# Patient Record
Sex: Male | Born: 2000 | Hispanic: Yes | Marital: Single | Smoking: Never smoker
Health system: Southern US, Community
[De-identification: ages and names within clinical notes are randomized; demographics above are authoritative.]

## PROBLEM LIST (undated history)

## (undated) DIAGNOSIS — R011 Cardiac murmur, unspecified: Secondary | ICD-10-CM

## (undated) SURGERY — Surgical Case
Anesthesia: *Unknown

---
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
## 2020-11-21 ENCOUNTER — Other Ambulatory Visit: Payer: Self-pay

## 2020-11-21 ENCOUNTER — Inpatient Hospital Stay (HOSPITAL_COMMUNITY)
Admission: EM | Admit: 2020-11-21 | Discharge: 2020-11-26 | DRG: 329 | Disposition: A | Discharge status: Home / Self Care | Payer: Medicaid Other | Attending: Surgery | Admitting: Surgery

## 2020-11-21 ENCOUNTER — Emergency Department (HOSPITAL_COMMUNITY): Payer: Medicaid Other

## 2020-11-21 ENCOUNTER — Emergency Department (HOSPITAL_COMMUNITY): Payer: Medicaid Other | Admitting: Certified Registered Nurse Anesthetist

## 2020-11-21 ENCOUNTER — Encounter (HOSPITAL_COMMUNITY): Admission: EM | Disposition: A | Discharge status: Home / Self Care | Payer: Self-pay | Source: Home / Self Care

## 2020-11-21 DIAGNOSIS — S1191XA Laceration without foreign body of unspecified part of neck, initial encounter: Secondary | ICD-10-CM

## 2020-11-21 DIAGNOSIS — T148XXA Other injury of unspecified body region, initial encounter: Principal | ICD-10-CM

## 2020-11-21 DIAGNOSIS — T1490XA Injury, unspecified, initial encounter: Secondary | ICD-10-CM

## 2020-11-21 DIAGNOSIS — S1181XA Laceration without foreign body of other specified part of neck, initial encounter: Secondary | ICD-10-CM | POA: Diagnosis present

## 2020-11-21 DIAGNOSIS — R739 Hyperglycemia, unspecified: Secondary | ICD-10-CM | POA: Diagnosis present

## 2020-11-21 DIAGNOSIS — W269XXA Contact with unspecified sharp object(s), initial encounter: Secondary | ICD-10-CM

## 2020-11-21 DIAGNOSIS — Z20822 Contact with and (suspected) exposure to covid-19: Secondary | ICD-10-CM | POA: Diagnosis present

## 2020-11-21 DIAGNOSIS — D72829 Elevated white blood cell count, unspecified: Secondary | ICD-10-CM | POA: Diagnosis present

## 2020-11-21 DIAGNOSIS — S36438A Laceration of other part of small intestine, initial encounter: Secondary | ICD-10-CM | POA: Diagnosis present

## 2020-11-21 DIAGNOSIS — D62 Acute posthemorrhagic anemia: Secondary | ICD-10-CM | POA: Diagnosis present

## 2020-11-21 DIAGNOSIS — S31611A Laceration without foreign body of abdominal wall, left upper quadrant with penetration into peritoneal cavity, initial encounter: Secondary | ICD-10-CM | POA: Diagnosis present

## 2020-11-21 HISTORY — PX: SMALL BOWEL REPAIR: SHX6447

## 2020-11-21 HISTORY — PX: ESOPHAGOGASTRODUODENOSCOPY: SHX5428

## 2020-11-21 HISTORY — PX: INCISION AND DRAINAGE OF WOUND: SHX1803

## 2020-11-21 HISTORY — PX: LAPAROTOMY: SHX154

## 2020-11-21 HISTORY — PX: ABDOMINAL WALL DEFECT REPAIR: SHX53

## 2020-11-21 LAB — CBC
HCT: 40.5 % (ref 39.0–52.0)
HCT: 32 % — ABNORMAL LOW (ref 39.0–52.0)
HCT: 28.2 % — ABNORMAL LOW (ref 39.0–52.0)
Hemoglobin: 12.9 g/dL — ABNORMAL LOW (ref 13.0–17.0)
Hemoglobin: 10.5 g/dL — ABNORMAL LOW (ref 13.0–17.0)
Hemoglobin: 9.4 g/dL — ABNORMAL LOW (ref 13.0–17.0)
MCH: 28.7 pg (ref 26.0–34.0)
MCH: 29.1 pg (ref 26.0–34.0)
MCH: 29.4 pg (ref 26.0–34.0)
MCHC: 31.9 g/dL (ref 30.0–36.0)
MCHC: 32.8 g/dL (ref 30.0–36.0)
MCHC: 33.3 g/dL (ref 30.0–36.0)
MCV: 90 fL (ref 80.0–100.0)
MCV: 88.6 fL (ref 80.0–100.0)
MCV: 88.1 fL (ref 80.0–100.0)
Platelets: 254 10*3/uL (ref 150–400)
Platelets: 174 10*3/uL (ref 150–400)
Platelets: 169 10*3/uL (ref 150–400)
RBC: 4.5 MIL/uL (ref 4.22–5.81)
RBC: 3.61 MIL/uL — ABNORMAL LOW (ref 4.22–5.81)
RBC: 3.2 MIL/uL — ABNORMAL LOW (ref 4.22–5.81)
RDW: 13.2 % (ref 11.5–15.5)
RDW: 13.2 % (ref 11.5–15.5)
RDW: 13.2 % (ref 11.5–15.5)
WBC: 12.3 10*3/uL — ABNORMAL HIGH (ref 4.0–10.5)
WBC: 20 10*3/uL — ABNORMAL HIGH (ref 4.0–10.5)
WBC: 20.5 10*3/uL — ABNORMAL HIGH (ref 4.0–10.5)
nRBC: 0 % (ref 0.0–0.2)
nRBC: 0 % (ref 0.0–0.2)
nRBC: 0 % (ref 0.0–0.2)

## 2020-11-21 LAB — COMPREHENSIVE METABOLIC PANEL
ALT: 52 U/L — ABNORMAL HIGH (ref 0–44)
AST: 49 U/L — ABNORMAL HIGH (ref 15–41)
Albumin: 3.5 g/dL (ref 3.5–5.0)
Alkaline Phosphatase: 71 U/L (ref 38–126)
Anion gap: 10 (ref 5–15)
BUN: 15 mg/dL (ref 6–20)
CO2: 24 mmol/L (ref 22–32)
Calcium: 8.8 mg/dL — ABNORMAL LOW (ref 8.9–10.3)
Chloride: 104 mmol/L (ref 98–111)
Creatinine, Ser: 1.26 mg/dL — ABNORMAL HIGH (ref 0.61–1.24)
GFR, Estimated: >60 mL/min (ref >60)
Glucose, Bld: 168 mg/dL — ABNORMAL HIGH (ref 70–99)
Potassium: 3.8 mmol/L (ref 3.5–5.1)
Sodium: 138 mmol/L (ref 135–145)
Total Bilirubin: 0.9 mg/dL (ref 0.3–1.2)
Total Protein: 6.4 g/dL — ABNORMAL LOW (ref 6.5–8.1)

## 2020-11-21 LAB — URINALYSIS, ROUTINE W REFLEX MICROSCOPIC
Bilirubin Urine: NEGATIVE
Glucose, UA: NEGATIVE mg/dL
Hgb urine dipstick: NEGATIVE
Ketones, ur: NEGATIVE mg/dL
Leukocytes,Ua: NEGATIVE
Nitrite: NEGATIVE
Protein, ur: NEGATIVE mg/dL
Specific Gravity, Urine: >1.046 — ABNORMAL HIGH (ref 1.005–1.030)
pH: 5 (ref 5.0–8.0)

## 2020-11-21 LAB — RESP PANEL BY RT-PCR (FLU A&B, COVID) ARPGX2
Influenza A by PCR: NEGATIVE
Influenza B by PCR: NEGATIVE
SARS Coronavirus 2 by RT PCR: NEGATIVE

## 2020-11-21 LAB — ETHANOL: Alcohol, Ethyl (B): <10 mg/dL (ref <10)

## 2020-11-21 LAB — I-STAT CHEM 8, ED
BUN: 20 mg/dL (ref 6–20)
Calcium, Ion: 1.15 mmol/L (ref 1.15–1.40)
Chloride: 105 mmol/L (ref 98–111)
Creatinine, Ser: 1.2 mg/dL (ref 0.61–1.24)
Glucose, Bld: 167 mg/dL — ABNORMAL HIGH (ref 70–99)
HCT: 40 % (ref 39.0–52.0)
Hemoglobin: 13.6 g/dL (ref 13.0–17.0)
Potassium: 3.7 mmol/L (ref 3.5–5.1)
Sodium: 141 mmol/L (ref 135–145)
TCO2: 26 mmol/L (ref 22–32)

## 2020-11-21 LAB — MRSA PCR SCREENING: MRSA by PCR: NEGATIVE

## 2020-11-21 LAB — PROTIME-INR
INR: 1.2 (ref 0.8–1.2)
Prothrombin Time: 14.5 seconds (ref 11.4–15.2)

## 2020-11-21 LAB — SAMPLE TO BLOOD BANK

## 2020-11-21 LAB — BASIC METABOLIC PANEL
Anion gap: 7 (ref 5–15)
BUN: 19 mg/dL (ref 6–20)
CO2: 23 mmol/L (ref 22–32)
Calcium: 8.1 mg/dL — ABNORMAL LOW (ref 8.9–10.3)
Chloride: 108 mmol/L (ref 98–111)
Creatinine, Ser: 1 mg/dL (ref 0.61–1.24)
GFR, Estimated: >60 mL/min (ref >60)
Glucose, Bld: 149 mg/dL — ABNORMAL HIGH (ref 70–99)
Potassium: 4.5 mmol/L (ref 3.5–5.1)
Sodium: 138 mmol/L (ref 135–145)

## 2020-11-21 LAB — ABO/RH: ABO/RH(D): O POS

## 2020-11-21 LAB — LACTIC ACID, PLASMA: Lactic Acid, Venous: 2 mmol/L (ref 0.5–1.9)

## 2020-11-21 LAB — PREPARE RBC (CROSSMATCH)

## 2020-11-21 LAB — HIV ANTIBODY (ROUTINE TESTING W REFLEX): HIV Screen 4th Generation wRfx: NONREACTIVE

## 2020-11-21 SURGERY — LAPAROTOMY, EXPLORATORY
Anesthesia: General | Site: Neck

## 2020-11-21 MED ORDER — HYDROMORPHONE HCL 1 MG/ML IJ SOLN
INTRAMUSCULAR | Status: AC
  Filled 2020-11-21: qty 0.5

## 2020-11-21 MED ORDER — CEFAZOLIN SODIUM-DEXTROSE 2-3 GM-%(50ML) IV SOLR
INTRAVENOUS | Status: DC | PRN
  Administered 2020-11-21: 2 g via INTRAVENOUS

## 2020-11-21 MED ORDER — LIDOCAINE 2% (20 MG/ML) 5 ML SYRINGE
INTRAMUSCULAR | Status: DC | PRN
  Administered 2020-11-21: 60 mg via INTRAVENOUS

## 2020-11-21 MED ORDER — METRONIDAZOLE IN NACL 5-0.79 MG/ML-% IV SOLN
INTRAVENOUS | Status: DC | PRN
  Administered 2020-11-21: 500 mg via INTRAVENOUS

## 2020-11-21 MED ORDER — DEXMEDETOMIDINE (PRECEDEX) IN NS 20 MCG/5ML (4 MCG/ML) IV SYRINGE
PREFILLED_SYRINGE | INTRAVENOUS | Status: DC | PRN
  Administered 2020-11-21 (×2): 8 ug via INTRAVENOUS
  Administered 2020-11-21 (×2): 12 ug via INTRAVENOUS

## 2020-11-21 MED ORDER — KETAMINE HCL 100 MG/ML IJ SOLN
INTRAMUSCULAR | Status: DC | PRN
  Administered 2020-11-21: 50 mg via INTRAVENOUS

## 2020-11-21 MED ORDER — ACETAMINOPHEN 10 MG/ML IV SOLN
INTRAVENOUS | Status: DC | PRN
  Administered 2020-11-21: 1000 mg via INTRAVENOUS

## 2020-11-21 MED ORDER — ACETAMINOPHEN 10 MG/ML IV SOLN
INTRAVENOUS | Status: AC
  Filled 2020-11-21: qty 100

## 2020-11-21 MED ORDER — SUGAMMADEX SODIUM 200 MG/2ML IV SOLN
INTRAVENOUS | Status: DC | PRN
  Administered 2020-11-21: 300 mg via INTRAVENOUS

## 2020-11-21 MED ORDER — 0.9 % SODIUM CHLORIDE (POUR BTL) OPTIME
TOPICAL | Status: DC | PRN
  Administered 2020-11-21 (×4): 1000 mL

## 2020-11-21 MED ORDER — PROPOFOL 500 MG/50ML IV EMUL
INTRAVENOUS | Status: DC | PRN
  Administered 2020-11-21: 50 ug/kg/min via INTRAVENOUS

## 2020-11-21 MED ORDER — IOHEXOL 350 MG/ML SOLN
75.0000 mL | Freq: Once | INTRAVENOUS | Status: AC | PRN
  Administered 2020-11-21: 75 mL via INTRAVENOUS

## 2020-11-21 MED ORDER — FENTANYL 2500MCG IN NS 250ML (10MCG/ML) PREMIX INFUSION
50.0000 ug/h | INTRAVENOUS | Status: DC
  Administered 2020-11-21: 50 ug/h via INTRAVENOUS
  Administered 2020-11-21: 200 ug/h via INTRAVENOUS
  Filled 2020-11-21 (×3): qty 250

## 2020-11-21 MED ORDER — LACTATED RINGERS IV SOLN: INTRAVENOUS | Status: DC | PRN

## 2020-11-21 MED ORDER — ALBUMIN HUMAN 5 % IV SOLN: INTRAVENOUS | Status: DC | PRN

## 2020-11-21 MED ORDER — ONDANSETRON 4 MG PO TBDP: 4.0000 mg | ORAL_TABLET | Freq: Four times a day (QID) | ORAL | Status: DC | PRN

## 2020-11-21 MED ORDER — FENTANYL CITRATE (PF) 250 MCG/5ML IJ SOLN
INTRAMUSCULAR | Status: AC
  Filled 2020-11-21: qty 5

## 2020-11-21 MED ORDER — PROPOFOL 10 MG/ML IV BOLUS
INTRAVENOUS | Status: DC | PRN
  Administered 2020-11-21: 200 mg via INTRAVENOUS
  Administered 2020-11-21: 100 mg via INTRAVENOUS

## 2020-11-21 MED ORDER — SUCCINYLCHOLINE CHLORIDE 200 MG/10ML IV SOSY
PREFILLED_SYRINGE | INTRAVENOUS | Status: DC | PRN
  Administered 2020-11-21: 140 mg via INTRAVENOUS

## 2020-11-21 MED ORDER — SODIUM CHLORIDE 0.9 % IV SOLN: INTRAVENOUS | Status: DC

## 2020-11-21 MED ORDER — ACETAMINOPHEN 10 MG/ML IV SOLN
1000.0000 mg | Freq: Four times a day (QID) | INTRAVENOUS | Status: AC
  Administered 2020-11-21 – 2020-11-22 (×3): 1000 mg via INTRAVENOUS
  Filled 2020-11-21 (×3): qty 100

## 2020-11-21 MED ORDER — SODIUM CHLORIDE 0.9 % IV SOLN: INTRAVENOUS | Status: DC | PRN

## 2020-11-21 MED ORDER — CHLORHEXIDINE GLUCONATE CLOTH 2 % EX PADS
6.0000 | MEDICATED_PAD | Freq: Every day | CUTANEOUS | Status: DC
  Administered 2020-11-24: 6 via TOPICAL

## 2020-11-21 MED ORDER — MIDAZOLAM HCL 2 MG/2ML IJ SOLN
INTRAMUSCULAR | Status: AC
  Filled 2020-11-21: qty 2

## 2020-11-21 MED ORDER — MIDAZOLAM HCL 5 MG/5ML IJ SOLN
INTRAMUSCULAR | Status: DC | PRN
  Administered 2020-11-21 (×2): 2 mg via INTRAVENOUS

## 2020-11-21 MED ORDER — FENTANYL CITRATE (PF) 250 MCG/5ML IJ SOLN
INTRAMUSCULAR | Status: DC | PRN
  Administered 2020-11-21: 100 ug via INTRAVENOUS
  Administered 2020-11-21: 150 ug via INTRAVENOUS
  Administered 2020-11-21: 50 ug via INTRAVENOUS
  Administered 2020-11-21 (×2): 100 ug via INTRAVENOUS

## 2020-11-21 MED ORDER — PROPOFOL 1000 MG/100ML IV EMUL
5.0000 ug/kg/min | INTRAVENOUS | Status: DC
  Administered 2020-11-21: 60 ug/kg/min via INTRAVENOUS
  Filled 2020-11-21: qty 100
  Filled 2020-11-21: qty 200

## 2020-11-21 MED ORDER — METOPROLOL TARTRATE 5 MG/5ML IV SOLN: 5.0000 mg | Freq: Four times a day (QID) | INTRAVENOUS | Status: DC | PRN

## 2020-11-21 MED ORDER — FENTANYL CITRATE (PF) 100 MCG/2ML IJ SOLN
50.0000 ug | Freq: Once | INTRAMUSCULAR | Status: AC
  Administered 2020-11-21: 50 ug via INTRAVENOUS

## 2020-11-21 MED ORDER — FENTANYL BOLUS VIA INFUSION
50.0000 ug | INTRAVENOUS | Status: DC | PRN
  Administered 2020-11-21 (×2): 50 ug via INTRAVENOUS
  Filled 2020-11-21: qty 50

## 2020-11-21 MED ORDER — DEXAMETHASONE SODIUM PHOSPHATE 10 MG/ML IJ SOLN
INTRAMUSCULAR | Status: DC | PRN
  Administered 2020-11-21: 5 mg via INTRAVENOUS

## 2020-11-21 MED ORDER — ONDANSETRON HCL 4 MG/2ML IJ SOLN: 4.0000 mg | Freq: Four times a day (QID) | INTRAMUSCULAR | Status: DC | PRN

## 2020-11-21 MED ORDER — SODIUM CHLORIDE 0.9% IV SOLUTION: Freq: Once | INTRAVENOUS | Status: DC

## 2020-11-21 MED ORDER — MIDAZOLAM HCL 2 MG/2ML IJ SOLN: 2.0000 mg | INTRAMUSCULAR | Status: DC | PRN

## 2020-11-21 MED ORDER — ROCURONIUM BROMIDE 10 MG/ML (PF) SYRINGE
PREFILLED_SYRINGE | INTRAVENOUS | Status: DC | PRN
  Administered 2020-11-21: 20 mg via INTRAVENOUS
  Administered 2020-11-21: 60 mg via INTRAVENOUS

## 2020-11-21 MED ORDER — PROPOFOL 1000 MG/100ML IV EMUL
0.0000 ug/kg/min | INTRAVENOUS | Status: DC
  Administered 2020-11-21 (×2): 50 ug/kg/min via INTRAVENOUS
  Filled 2020-11-21: qty 100
  Filled 2020-11-21: qty 200
  Filled 2020-11-21: qty 100

## 2020-11-21 MED ORDER — METHOCARBAMOL 1000 MG/10ML IJ SOLN
1000.0000 mg | Freq: Three times a day (TID) | INTRAVENOUS | Status: DC | PRN
  Filled 2020-11-21: qty 10

## 2020-11-21 MED ORDER — PANTOPRAZOLE SODIUM 40 MG IV SOLR
40.0000 mg | INTRAVENOUS | Status: DC
  Administered 2020-11-21 – 2020-11-23 (×3): 40 mg via INTRAVENOUS
  Filled 2020-11-21: qty 40

## 2020-11-21 MED ORDER — HYDROMORPHONE HCL 1 MG/ML IJ SOLN
INTRAMUSCULAR | Status: DC | PRN
  Administered 2020-11-21 (×2): .5 mg via INTRAVENOUS

## 2020-11-21 MED ORDER — KETAMINE HCL 100 MG/ML IJ SOLN
INTRAMUSCULAR | Status: AC
  Filled 2020-11-21: qty 1

## 2020-11-21 MED ORDER — ONDANSETRON HCL 4 MG/2ML IJ SOLN
INTRAMUSCULAR | Status: DC | PRN
  Administered 2020-11-21: 4 mg via INTRAVENOUS

## 2020-11-21 SURGICAL SUPPLY — 51 items
BLADE CLIPPER SURG (BLADE) ×4 IMPLANT
BLADE SURG 15 STRL LF DISP TIS (BLADE) ×3 IMPLANT
BLADE SURG 15 STRL SS (BLADE) ×1
CANISTER SUCT 3000ML PPV (MISCELLANEOUS) ×4 IMPLANT
CHLORAPREP W/TINT 26 (MISCELLANEOUS) ×4 IMPLANT
COVER SURGICAL LIGHT HANDLE (MISCELLANEOUS) ×4 IMPLANT
COVER WAND RF STERILE (DRAPES) IMPLANT
DRAPE INCISE IOBAN 66X45 STRL (DRAPES) ×4 IMPLANT
DRAPE LAPAROSCOPIC ABDOMINAL (DRAPES) ×4 IMPLANT
DRAPE WARM FLUID 44X44 (DRAPES) ×4 IMPLANT
DRSG OPSITE POSTOP 3X4 (GAUZE/BANDAGES/DRESSINGS) ×8 IMPLANT
DRSG OPSITE POSTOP 4X10 (GAUZE/BANDAGES/DRESSINGS) ×4 IMPLANT
ELECT BLADE 6.5 EXT (BLADE) IMPLANT
ELECT CAUTERY BLADE 6.4 (BLADE) ×4 IMPLANT
ELECT REM PT RETURN 9FT ADLT (ELECTROSURGICAL) ×4
ELECTRODE REM PT RTRN 9FT ADLT (ELECTROSURGICAL) ×3 IMPLANT
GLOVE BIOGEL PI IND STRL 6 (GLOVE) ×3 IMPLANT
GLOVE BIOGEL PI INDICATOR 6 (GLOVE) ×1
GLOVE BIOGEL PI MICRO 5.5 (GLOVE) ×1
GLOVE BIOGEL PI MICRO STRL 5.5 (GLOVE) ×3 IMPLANT
GOWN STRL REUS W/ TWL LRG LVL3 (GOWN DISPOSABLE) ×12 IMPLANT
GOWN STRL REUS W/TWL LRG LVL3 (GOWN DISPOSABLE) ×4
HANDLE SUCTION POOLE (INSTRUMENTS) ×3 IMPLANT
KIT BASIN OR (CUSTOM PROCEDURE TRAY) ×4 IMPLANT
KIT TURNOVER KIT B (KITS) ×4 IMPLANT
LIGASURE IMPACT 36 18CM CVD LR (INSTRUMENTS) IMPLANT
NS IRRIG 1000ML POUR BTL (IV SOLUTION) ×8 IMPLANT
PACK GENERAL/GYN (CUSTOM PROCEDURE TRAY) ×4 IMPLANT
PAD ARMBOARD 7.5X6 YLW CONV (MISCELLANEOUS) ×4 IMPLANT
PENCIL SMOKE EVACUATOR (MISCELLANEOUS) ×4 IMPLANT
SPECIMEN JAR LARGE (MISCELLANEOUS) IMPLANT
SPONGE LAP 18X18 RF (DISPOSABLE) ×12 IMPLANT
STAPLER VISISTAT 35W (STAPLE) ×8 IMPLANT
SUCTION POOLE HANDLE (INSTRUMENTS) ×4
SUT ETHILON 3 0 FSL (SUTURE) ×4 IMPLANT
SUT ETHILON 3 0 PS 1 (SUTURE) ×4 IMPLANT
SUT MNCRL AB 4-0 PS2 18 (SUTURE) IMPLANT
SUT PDS AB 1 CT  36 (SUTURE) ×3
SUT PDS AB 1 CT 36 (SUTURE) ×9 IMPLANT
SUT PDS AB 1 TP1 96 (SUTURE) ×8 IMPLANT
SUT SILK 2 0 SH CR/8 (SUTURE) ×4 IMPLANT
SUT SILK 2 0 TIES 10X30 (SUTURE) ×4 IMPLANT
SUT SILK 3 0 RB1 (SUTURE) ×4 IMPLANT
SUT SILK 3 0 SH CR/8 (SUTURE) ×4 IMPLANT
SUT SILK 3 0 TIES 10X30 (SUTURE) ×4 IMPLANT
SUT VIC AB 3-0 SH 18 (SUTURE) ×12 IMPLANT
SUT VIC AB 3-0 SH 27 (SUTURE)
SUT VIC AB 3-0 SH 27XBRD (SUTURE) IMPLANT
TOWEL GREEN STERILE (TOWEL DISPOSABLE) ×4 IMPLANT
TRAY FOLEY MTR SLVR 16FR STAT (SET/KITS/TRAYS/PACK) ×4 IMPLANT
YANKAUER SUCT BULB TIP NO VENT (SUCTIONS) IMPLANT

## 2020-11-21 NOTE — Anesthesia Procedure Notes (Signed)
Arterial Line Insertion Start/End2/25/2022 10:10 AM, 11/21/2020 10:15 AM Performed by: Lannie Fields, DO, anesthesiologist  Patient location: Pre-op. Preanesthetic checklist: patient identified, IV checked, site marked, risks and benefits discussed, surgical consent, monitors and equipment checked, pre-op evaluation, timeout performed and anesthesia consent Lidocaine 1% used for infiltration radial was placed Catheter size: 20 Fr Hand hygiene performed , maximum sterile barriers used  and Seldinger technique used Allen's test indicative of satisfactory collateral circulation Attempts: 2 Procedure performed without using ultrasound guided technique. Following insertion, dressing applied. Post procedure assessment: normal and unchanged  Patient tolerated the procedure well with no immediate complications. Additional procedure comments: crna attempt on right radial unsuccessful .
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
## 2020-11-21 NOTE — ED Notes (Signed)
Pt remains alert and oriented, O2 sats dropping to 86% RA, placed on NRB at 15L and recovering to 100%.  States pain is mostly in LLQ.
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
## 2020-11-21 NOTE — OR Nursing (Signed)
Critical lab value received 11/20/2020 at 1041: lactic acid 2.0. Dr. Freida Busman notified of critical value immediately in OR.

## 2020-11-21 NOTE — Anesthesia Procedure Notes (Signed)
Procedure Name: Intubation Date/Time: 11/21/2020 10:00 AM Performed by: Waynard Edwards, CRNA Pre-anesthesia Checklist: Patient identified, Emergency Drugs available, Suction available, Patient being monitored and Timeout performed Patient Re-evaluated:Patient Re-evaluated prior to induction Oxygen Delivery Method: Circle system utilized Preoxygenation: Pre-oxygenation with 100% oxygen Induction Type: IV induction, Rapid sequence and Cricoid Pressure applied Laryngoscope Size: Glidescope and 4 Grade View: Grade I Tube type: Oral Tube size: 7.5 mm Number of attempts: 1 Airway Equipment and Method: Video-laryngoscopy and Rigid stylet Placement Confirmation: ETT inserted through vocal cords under direct vision,  breath sounds checked- equal and bilateral and CO2 detector Secured at: 22 cm Tube secured with: Tape Dental Injury: Teeth and Oropharynx as per pre-operative assessment

## 2020-11-21 NOTE — TOC CAGE-AID Note (Signed)
Transition of Care [facility]) - CAGE-AID Screening   Patient Details  Name: Ordell Prichett MRN: [id number] Date of Birth: 17-Apr-2001  Transition of Care [facility]) CM/SW Contact:    Mearl Latin, LCSW Phone Number: 11/21/2020, 2:32 PM   Clinical Narrative: Patient currently intubated and not able to participate.    CAGE-AID Screening: Substance Abuse Screening unable to be completed due to: : Patient unable to participate
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
## 2020-11-21 NOTE — Transfer of Care (Signed)
Immediate Anesthesia Transfer of Care Note  Patient: Todd Waters  Procedure(s) Performed: EXPLORATORY LAPAROTOMY (N/A Abdomen) IRRIGATION, DEBRIDEMENT, AND CLOSURE OF LEFT NECK WOUND (Left Neck) ESOPHAGOGASTRODUODENOSCOPY (EGD) (N/A Esophagus) ENTEROTOMY REPAIR (N/A Abdomen) REPAIR  OF ABDOMINAL WALL WOUND (N/A Abdomen)  Patient Location: 4 N ICU  Anesthesia Type:General  Level of Consciousness: sedated and Patient remains intubated per anesthesia plan  Airway & Oxygen Therapy: Patient remains intubated per anesthesia plan and Patient placed on Ventilator (see vital sign flow sheet for setting)  Post-op Assessment: Report given to RN and Post -op Vital signs reviewed and stable  Post vital signs: Reviewed and stable  Last Vitals:  Vitals Value Taken Time  BP 120/49 11/21/20 1300  Temp    Pulse 90 11/21/20 1303  Resp 16 11/21/20 1303  SpO2 100 % 11/21/20 1303  Vitals shown include unvalidated device data.  Last Pain:  Vitals:   11/21/20 0926  TempSrc: Oral  PainSc: 6          Complications: No complications documented.

## 2020-11-22 ENCOUNTER — Inpatient Hospital Stay (HOSPITAL_COMMUNITY): Payer: Medicaid Other

## 2020-11-22 LAB — CBC
HCT: 25.8 % — ABNORMAL LOW (ref 39.0–52.0)
HCT: 24.1 % — ABNORMAL LOW (ref 39.0–52.0)
HCT: 27.7 % — ABNORMAL LOW (ref 39.0–52.0)
Hemoglobin: 8.3 g/dL — ABNORMAL LOW (ref 13.0–17.0)
Hemoglobin: 8.2 g/dL — ABNORMAL LOW (ref 13.0–17.0)
Hemoglobin: 8.9 g/dL — ABNORMAL LOW (ref 13.0–17.0)
MCH: 28.8 pg (ref 26.0–34.0)
MCH: 29.6 pg (ref 26.0–34.0)
MCH: 29 pg (ref 26.0–34.0)
MCHC: 32.2 g/dL (ref 30.0–36.0)
MCHC: 34 g/dL (ref 30.0–36.0)
MCHC: 32.1 g/dL (ref 30.0–36.0)
MCV: 89.6 fL (ref 80.0–100.0)
MCV: 87 fL (ref 80.0–100.0)
MCV: 90.2 fL (ref 80.0–100.0)
Platelets: 148 10*3/uL — ABNORMAL LOW (ref 150–400)
Platelets: 133 10*3/uL — ABNORMAL LOW (ref 150–400)
Platelets: 156 10*3/uL (ref 150–400)
RBC: 2.88 MIL/uL — ABNORMAL LOW (ref 4.22–5.81)
RBC: 2.77 MIL/uL — ABNORMAL LOW (ref 4.22–5.81)
RBC: 3.07 MIL/uL — ABNORMAL LOW (ref 4.22–5.81)
RDW: 13.3 % (ref 11.5–15.5)
RDW: 13.3 % (ref 11.5–15.5)
RDW: 13.6 % (ref 11.5–15.5)
WBC: 16 10*3/uL — ABNORMAL HIGH (ref 4.0–10.5)
WBC: 15.3 10*3/uL — ABNORMAL HIGH (ref 4.0–10.5)
WBC: 15.6 10*3/uL — ABNORMAL HIGH (ref 4.0–10.5)
nRBC: 0 % (ref 0.0–0.2)
nRBC: 0 % (ref 0.0–0.2)
nRBC: 0 % (ref 0.0–0.2)

## 2020-11-22 LAB — BASIC METABOLIC PANEL
Anion gap: 6 (ref 5–15)
BUN: 17 mg/dL (ref 6–20)
CO2: 23 mmol/L (ref 22–32)
Calcium: 8.2 mg/dL — ABNORMAL LOW (ref 8.9–10.3)
Chloride: 108 mmol/L (ref 98–111)
Creatinine, Ser: 1.05 mg/dL (ref 0.61–1.24)
GFR, Estimated: >60 mL/min (ref >60)
Glucose, Bld: 103 mg/dL — ABNORMAL HIGH (ref 70–99)
Potassium: 3.7 mmol/L (ref 3.5–5.1)
Sodium: 137 mmol/L (ref 135–145)

## 2020-11-22 LAB — POCT I-STAT 7, (LYTES, BLD GAS, ICA,H+H)
Acid-Base Excess: 1 mmol/L (ref 0.0–2.0)
Acid-base deficit: 2 mmol/L (ref 0.0–2.0)
Bicarbonate: 24.4 mmol/L (ref 20.0–28.0)
Bicarbonate: 28.1 mmol/L — ABNORMAL HIGH (ref 20.0–28.0)
Calcium, Ion: 1.23 mmol/L (ref 1.15–1.40)
Calcium, Ion: 1.18 mmol/L (ref 1.15–1.40)
HCT: 32 % — ABNORMAL LOW (ref 39.0–52.0)
HCT: 27 % — ABNORMAL LOW (ref 39.0–52.0)
Hemoglobin: 10.9 g/dL — ABNORMAL LOW (ref 13.0–17.0)
Hemoglobin: 9.2 g/dL — ABNORMAL LOW (ref 13.0–17.0)
O2 Saturation: 98 %
O2 Saturation: 100 %
Potassium: 4.9 mmol/L (ref 3.5–5.1)
Potassium: 5.2 mmol/L — ABNORMAL HIGH (ref 3.5–5.1)
Sodium: 139 mmol/L (ref 135–145)
Sodium: 138 mmol/L (ref 135–145)
TCO2: 26 mmol/L (ref 22–32)
TCO2: 30 mmol/L (ref 22–32)
pCO2 arterial: 49.5 mmHg — ABNORMAL HIGH (ref 32.0–48.0)
pCO2 arterial: 58 mmHg — ABNORMAL HIGH (ref 32.0–48.0)
pH, Arterial: 7.3 — ABNORMAL LOW (ref 7.350–7.450)
pH, Arterial: 7.294 — ABNORMAL LOW (ref 7.350–7.450)
pO2, Arterial: 110 mmHg — ABNORMAL HIGH (ref 83.0–108.0)
pO2, Arterial: 418 mmHg — ABNORMAL HIGH (ref 83.0–108.0)

## 2020-11-22 LAB — TRIGLYCERIDES: Triglycerides: 104 mg/dL (ref <150)

## 2020-11-22 MED ORDER — ORAL CARE MOUTH RINSE
15.0000 mL | Freq: Two times a day (BID) | OROMUCOSAL | Status: DC
  Administered 2020-11-22 – 2020-11-25 (×6): 15 mL via OROMUCOSAL

## 2020-11-22 MED ORDER — KETOROLAC TROMETHAMINE 30 MG/ML IJ SOLN
30.0000 mg | Freq: Four times a day (QID) | INTRAMUSCULAR | Status: DC | PRN
  Administered 2020-11-22 – 2020-11-23 (×3): 30 mg via INTRAVENOUS
  Filled 2020-11-22 (×3): qty 1

## 2020-11-22 MED ORDER — ORAL CARE MOUTH RINSE
15.0000 mL | OROMUCOSAL | Status: DC
  Administered 2020-11-22: 15 mL via OROMUCOSAL

## 2020-11-22 MED ORDER — CHLORHEXIDINE GLUCONATE 0.12% ORAL RINSE (MEDLINE KIT)
15.0000 mL | Freq: Two times a day (BID) | OROMUCOSAL | Status: DC
  Administered 2020-11-22: 15 mL via OROMUCOSAL

## 2020-11-22 MED ORDER — MORPHINE SULFATE (PF) 2 MG/ML IV SOLN: 1.0000 mg | INTRAVENOUS | Status: DC | PRN

## 2020-11-22 MED ORDER — KETOROLAC TROMETHAMINE 30 MG/ML IJ SOLN: 30.0000 mg | Freq: Four times a day (QID) | INTRAMUSCULAR | Status: DC

## 2020-11-22 MED ORDER — KETOROLAC TROMETHAMINE 30 MG/ML IJ SOLN
30.0000 mg | Freq: Once | INTRAMUSCULAR | Status: AC
  Administered 2020-11-22: 30 mg via INTRAVENOUS
  Filled 2020-11-22: qty 1
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
## 2020-11-22 NOTE — Progress Notes (Signed)
Took verbal order from Dr. Donell Beers for toradol 30mg  x 1 then q6PRN and for a clear liquid diet.  Both orders have been input.

## 2020-11-22 NOTE — Procedures (Signed)
Extubation Procedure Note  Patient Details:   Name: Todd Waters DOB: September 29, 2000 MRN: [id number]   Airway Documentation:    Vent end date: 11/22/20 Vent end time: 0848   Evaluation  O2 sats: stable throughout Complications: No apparent complications Patient did tolerate procedure well. Bilateral Breath Sounds: Diminished,Clear   Yes   RT extubated patient to 4L [place] per MD order with RN at bedside. Positive cuff leak noted. Patient tolerated well and currently sating 100%. No stridor noted at this time. RT will continue to monitor as needed.   Jaquelyn Bitter 11/22/2020, 9:04 AM

## 2020-11-23 LAB — CBC
HCT: 28.7 % — ABNORMAL LOW (ref 39.0–52.0)
Hemoglobin: 9.2 g/dL — ABNORMAL LOW (ref 13.0–17.0)
MCH: 28.8 pg (ref 26.0–34.0)
MCHC: 32.1 g/dL (ref 30.0–36.0)
MCV: 89.7 fL (ref 80.0–100.0)
Platelets: 133 10*3/uL — ABNORMAL LOW (ref 150–400)
RBC: 3.2 MIL/uL — ABNORMAL LOW (ref 4.22–5.81)
RDW: 13.2 % (ref 11.5–15.5)
WBC: 10.8 10*3/uL — ABNORMAL HIGH (ref 4.0–10.5)
nRBC: 0 % (ref 0.0–0.2)

## 2020-11-23 LAB — BASIC METABOLIC PANEL
Anion gap: 9 (ref 5–15)
BUN: 11 mg/dL (ref 6–20)
CO2: 25 mmol/L (ref 22–32)
Calcium: 8.7 mg/dL — ABNORMAL LOW (ref 8.9–10.3)
Chloride: 107 mmol/L (ref 98–111)
Creatinine, Ser: 1 mg/dL (ref 0.61–1.24)
GFR, Estimated: >60 mL/min (ref >60)
Glucose, Bld: 87 mg/dL (ref 70–99)
Potassium: 3.7 mmol/L (ref 3.5–5.1)
Sodium: 141 mmol/L (ref 135–145)
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
## 2020-11-23 NOTE — Progress Notes (Signed)
Patient transferred to 6N with all belongings.

## 2020-11-23 NOTE — Progress Notes (Signed)
Received from 4 N accompanied by 2 NTs and his mother. A& O x4 ,Pain level at 6. Oriented to floor set up . Yankeur suction provided.

## 2020-11-23 NOTE — Plan of Care (Signed)
  Problem: Health Behavior/Discharge Planning: Goal: Ability to manage health-related needs will improve Outcome: Progressing   

## 2020-11-24 ENCOUNTER — Encounter (HOSPITAL_COMMUNITY): Payer: Self-pay | Admitting: Surgery

## 2020-11-24 LAB — BASIC METABOLIC PANEL
Anion gap: 8 (ref 5–15)
BUN: 8 mg/dL (ref 6–20)
CO2: 24 mmol/L (ref 22–32)
Calcium: 8.4 mg/dL — ABNORMAL LOW (ref 8.9–10.3)
Chloride: 110 mmol/L (ref 98–111)
Creatinine, Ser: 0.93 mg/dL (ref 0.61–1.24)
GFR, Estimated: >60 mL/min (ref >60)
Glucose, Bld: 93 mg/dL (ref 70–99)
Potassium: 3.5 mmol/L (ref 3.5–5.1)
Sodium: 142 mmol/L (ref 135–145)

## 2020-11-24 LAB — BPAM RBC
Blood Product Expiration Date: 202203282359
Blood Product Expiration Date: 202203282359
Unit Type and Rh: 5100
Unit Type and Rh: 5100

## 2020-11-24 LAB — CBC
HCT: 24 % — ABNORMAL LOW (ref 39.0–52.0)
Hemoglobin: 8.1 g/dL — ABNORMAL LOW (ref 13.0–17.0)
MCH: 29.2 pg (ref 26.0–34.0)
MCHC: 33.8 g/dL (ref 30.0–36.0)
MCV: 86.6 fL (ref 80.0–100.0)
Platelets: 140 10*3/uL — ABNORMAL LOW (ref 150–400)
RBC: 2.77 MIL/uL — ABNORMAL LOW (ref 4.22–5.81)
RDW: 12.9 % (ref 11.5–15.5)
WBC: 11 10*3/uL — ABNORMAL HIGH (ref 4.0–10.5)
nRBC: 0 % (ref 0.0–0.2)

## 2020-11-24 LAB — TYPE AND SCREEN
ABO/RH(D): O POS
Antibody Screen: NEGATIVE
Unit division: 0
Unit division: 0

## 2020-11-24 MED ORDER — LIDOCAINE 5 % EX PTCH
1.0000 | MEDICATED_PATCH | Freq: Every day | CUTANEOUS | Status: DC
  Administered 2020-11-24 – 2020-11-26 (×3): 1 via TRANSDERMAL
  Filled 2020-11-24 (×3): qty 1

## 2020-11-24 MED ORDER — POTASSIUM CHLORIDE 20 MEQ PO PACK
40.0000 meq | PACK | Freq: Once | ORAL | Status: AC
  Administered 2020-11-24: 40 meq via ORAL
  Filled 2020-11-24: qty 2

## 2020-11-24 MED ORDER — MORPHINE SULFATE (PF) 2 MG/ML IV SOLN: 1.0000 mg | INTRAVENOUS | Status: DC | PRN

## 2020-11-24 MED ORDER — ENSURE ENLIVE PO LIQD
237.0000 mL | Freq: Three times a day (TID) | ORAL | Status: DC
  Administered 2020-11-24 – 2020-11-25 (×4): 237 mL via ORAL

## 2020-11-24 MED ORDER — OXYCODONE HCL 5 MG PO TABS
5.0000 mg | ORAL_TABLET | ORAL | Status: DC | PRN
  Administered 2020-11-24 – 2020-11-26 (×5): 5 mg via ORAL
  Filled 2020-11-24 (×6): qty 1

## 2020-11-24 MED ORDER — ENOXAPARIN SODIUM 30 MG/0.3ML ~~LOC~~ SOLN
30.0000 mg | Freq: Two times a day (BID) | SUBCUTANEOUS | Status: DC
  Administered 2020-11-24 – 2020-11-25 (×4): 30 mg via SUBCUTANEOUS
  Filled 2020-11-24 (×5): qty 0.3

## 2020-11-24 MED ORDER — PANTOPRAZOLE SODIUM 40 MG PO TBEC
40.0000 mg | DELAYED_RELEASE_TABLET | Freq: Every day | ORAL | Status: DC
  Administered 2020-11-24 – 2020-11-25 (×2): 40 mg via ORAL
  Filled 2020-11-24 (×2): qty 1

## 2020-11-24 MED ORDER — BISACODYL 10 MG RE SUPP: 10.0000 mg | Freq: Every day | RECTAL | Status: DC | PRN

## 2020-11-24 MED ORDER — DOCUSATE SODIUM 100 MG PO CAPS
100.0000 mg | ORAL_CAPSULE | Freq: Two times a day (BID) | ORAL | Status: DC
  Administered 2020-11-24 – 2020-11-26 (×4): 100 mg via ORAL
  Filled 2020-11-24 (×5): qty 1
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
## 2020-11-24 NOTE — Anesthesia Postprocedure Evaluation (Signed)
Anesthesia Post Note  Patient: Public librarian  Procedure(s) Performed: EXPLORATORY LAPAROTOMY (N/A Abdomen) IRRIGATION, DEBRIDEMENT, AND CLOSURE OF LEFT NECK WOUND (Left Neck) ESOPHAGOGASTRODUODENOSCOPY (EGD) (N/A Esophagus) ENTEROTOMY REPAIR (N/A Abdomen) REPAIR  OF ABDOMINAL WALL WOUND (N/A Abdomen)     Patient location during evaluation: SICU Anesthesia Type: General Level of consciousness: sedated Pain management: pain level controlled Vital Signs Assessment: post-procedure vital signs reviewed and stable Respiratory status: patient remains intubated per anesthesia plan Cardiovascular status: stable Postop Assessment: no apparent nausea or vomiting Anesthetic complications: no   No complications documented.  Last Vitals:  Vitals:   11/24/20 0145 11/24/20 0600  BP: 139/67 136/76  Pulse: (!) 55 82  Resp: 17 18  Temp: 36.9 C 36.6 C  SpO2: 100% 100%    Last Pain:  Vitals:   11/24/20 0600  TempSrc: Oral  PainSc:                  Mellody Dance

## 2020-11-25 LAB — CBC
HCT: 23.8 % — ABNORMAL LOW (ref 39.0–52.0)
Hemoglobin: 8.2 g/dL — ABNORMAL LOW (ref 13.0–17.0)
MCH: 29.5 pg (ref 26.0–34.0)
MCHC: 34.5 g/dL (ref 30.0–36.0)
MCV: 85.6 fL (ref 80.0–100.0)
Platelets: 163 10*3/uL (ref 150–400)
RBC: 2.78 MIL/uL — ABNORMAL LOW (ref 4.22–5.81)
RDW: 12.7 % (ref 11.5–15.5)
WBC: 9.5 10*3/uL (ref 4.0–10.5)
nRBC: 0 % (ref 0.0–0.2)

## 2020-11-25 LAB — BASIC METABOLIC PANEL
Anion gap: 8 (ref 5–15)
BUN: 6 mg/dL (ref 6–20)
CO2: 25 mmol/L (ref 22–32)
Calcium: 8.6 mg/dL — ABNORMAL LOW (ref 8.9–10.3)
Chloride: 106 mmol/L (ref 98–111)
Creatinine, Ser: 1.05 mg/dL (ref 0.61–1.24)
GFR, Estimated: >60 mL/min (ref >60)
Glucose, Bld: 100 mg/dL — ABNORMAL HIGH (ref 70–99)
Potassium: 3.3 mmol/L — ABNORMAL LOW (ref 3.5–5.1)
Sodium: 139 mmol/L (ref 135–145)

## 2020-11-25 LAB — MAGNESIUM: Magnesium: 1.8 mg/dL (ref 1.7–2.4)

## 2020-11-25 NOTE — Progress Notes (Signed)
Patient reports having 2 maroon stools today. Unwitnessed by staff as patient flushed the specimen even after being asked to save for staff. Will contact on call as patient would like to go home tonight if possible.
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
## 2020-11-25 NOTE — Discharge Instructions (Signed)
[facility] [phone]  OPEN ABDOMINAL SURGERY: POST OP INSTRUCTIONS  Always review your discharge instruction sheet given to you by the facility where your surgery was performed.  IF YOU HAVE DISABILITY OR FAMILY LEAVE FORMS, YOU MUST BRING THEM TO THE OFFICE FOR PROCESSING.  PLEASE DO NOT GIVE THEM TO YOUR DOCTOR.  1. A prescription for pain medication may be given to you upon discharge.  Take your pain medication as prescribed, if needed.  If narcotic pain medicine is not needed, then you may take acetaminophen (Tylenol) or ibuprofen (Advil) as needed. 2. Take your usually prescribed medications unless otherwise directed. 3. If you need a refill on your pain medication, please contact your pharmacy. They will contact our office to request authorization.  Prescriptions will not be filled after 5pm or on week-ends. 4. You should follow a light diet the first few days after arrival home, such as soup and crackers, pudding, etc.unless your doctor has advised otherwise. A high-fiber, low fat diet can be resumed as tolerated.   Be sure to include lots of fluids daily. Most patients will experience some swelling and bruising on the chest and neck area.  Ice packs will help.  Swelling and bruising can take several days to resolve 5. Most patients will experience some swelling and bruising in the area of the incision. Ice pack will help. Swelling and bruising can take several days to resolve..  6. It is common to experience some constipation if taking pain medication after surgery.  Increasing fluid intake and taking a stool softener will usually help or prevent this problem from occurring.  A mild laxative (Milk of Magnesia or Miralax) should be taken according to package directions if there are no bowel movements after 48 hours. 7.  You may have steri-strips (small skin tapes) in place directly over the incision.  These strips should be left on the skin for 7-10 days.  If your  surgeon used skin glue on the incision, you may shower in 24 hours.  The glue will flake off over the next 2-3 weeks.  Any sutures or staples will be removed at the office during your follow-up visit. You may find that a light gauze bandage over your incision may keep your staples from being rubbed or pulled. You may shower and replace the bandage daily. 8. ACTIVITIES:  You may resume regular (light) daily activities beginning the next day--such as daily self-care, walking, climbing stairs--gradually increasing activities as tolerated.  You may have sexual intercourse when it is comfortable.  Refrain from any heavy lifting or straining until approved by your doctor. a. You may drive when you no longer are taking prescription pain medication, you can comfortably wear a seatbelt, and you can safely maneuver your car and apply brakes b. Return to Work: ___________________________________ 9. You should see your doctor in the office for a follow-up appointment approximately two weeks after your surgery.  Make sure that you call for this appointment within a day or two after you arrive home to insure a convenient appointment time. OTHER INSTRUCTIONS:  _____________________________________________________________ _____________________________________________________________  WHEN TO CALL YOUR DOCTOR: 1. Fever over 101.0 2. Inability to urinate 3. Nausea and/or vomiting 4. Extreme swelling or bruising 5. Continued bleeding from incision. 6. Increased pain, redness, or drainage from the incision. 7. Difficulty swallowing or breathing 8. Muscle cramping or spasms. 9. Numbness or tingling in hands or feet or around lips.  The clinic staff is available to   answer your questions during regular business hours.  Please don't hesitate to call and ask to speak to one of the nurses if you have concerns.  For further questions, please visit [email or web address]   

## 2020-11-26 ENCOUNTER — Other Ambulatory Visit: Payer: Self-pay | Admitting: General Surgery

## 2020-11-26 LAB — BASIC METABOLIC PANEL
Anion gap: 8 (ref 5–15)
BUN: 5 mg/dL — ABNORMAL LOW (ref 6–20)
CO2: 26 mmol/L (ref 22–32)
Calcium: 8.9 mg/dL (ref 8.9–10.3)
Chloride: 106 mmol/L (ref 98–111)
Creatinine, Ser: 1.08 mg/dL (ref 0.61–1.24)
GFR, Estimated: >60 mL/min (ref >60)
Glucose, Bld: 99 mg/dL (ref 70–99)
Potassium: 3.5 mmol/L (ref 3.5–5.1)
Sodium: 140 mmol/L (ref 135–145)

## 2020-11-26 LAB — CBC
HCT: 27.4 % — ABNORMAL LOW (ref 39.0–52.0)
Hemoglobin: 9 g/dL — ABNORMAL LOW (ref 13.0–17.0)
MCH: 28.8 pg (ref 26.0–34.0)
MCHC: 32.8 g/dL (ref 30.0–36.0)
MCV: 87.8 fL (ref 80.0–100.0)
Platelets: 209 10*3/uL (ref 150–400)
RBC: 3.12 MIL/uL — ABNORMAL LOW (ref 4.22–5.81)
RDW: 12.9 % (ref 11.5–15.5)
WBC: 9.9 10*3/uL (ref 4.0–10.5)
nRBC: 0 % (ref 0.0–0.2)

## 2020-11-26 MED ORDER — METHOCARBAMOL 500 MG PO TABS: 500.0000 mg | ORAL_TABLET | Freq: Three times a day (TID) | ORAL | 0 refills | Status: AC | PRN

## 2020-11-26 MED ORDER — OXYCODONE HCL 5 MG PO TABS: 5.0000 mg | ORAL_TABLET | ORAL | 0 refills | Status: AC | PRN

## 2020-11-26 MED FILL — oxyCODONE HCL 5 MG TABS: 5 | 4 days supply | Qty: 20 | Fill #0

## 2020-11-26 MED FILL — METHOCARBAMOL 500 MG TABS: 500 | 5 days supply | Qty: 30 | Fill #0
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
## 2020-11-26 NOTE — Plan of Care (Signed)

## 2021-06-04 ENCOUNTER — Emergency Department (HOSPITAL_COMMUNITY)
Admission: EM | Admit: 2021-06-04 | Discharge: 2021-06-04 | Disposition: A | Discharge status: Home / Self Care | Payer: Medicaid Other | Attending: Emergency Medicine | Admitting: Emergency Medicine

## 2021-06-04 ENCOUNTER — Other Ambulatory Visit: Payer: Self-pay

## 2021-06-04 ENCOUNTER — Emergency Department (HOSPITAL_COMMUNITY): Payer: Medicaid Other

## 2021-06-04 DIAGNOSIS — R6884 Jaw pain: Secondary | ICD-10-CM | POA: Diagnosis present

## 2021-06-04 DIAGNOSIS — Z5321 Procedure and treatment not carried out due to patient leaving prior to being seen by health care provider: Secondary | ICD-10-CM | POA: Insufficient documentation

## 2021-06-04 NOTE — ED Notes (Signed)
PT eloped.

## 2021-06-04 NOTE — ED Provider Notes (Signed)
Emergency Medicine Provider Triage Evaluation Note  Todd Waters , a 20 y.o. male  was evaluated in triage.  Pt complains of jaw pain. Started today while eating around 3:45pm and felt his jaw was loose and he had to "work it to get it back in". Has never happened before  Review of Systems  Positive: Jaw pain Negative: dysphagia  Physical Exam  BP 131/68 (BP Location: Left Arm)   Pulse 68   Temp 98.6 F (37 C)   Resp 14   SpO2 100%  Gen:   Awake, no distress   Resp:  Normal effort  MSK:   Moves extremities without difficulty  Other:  Pt started having facial twitching and jaw pulled down to the right side, resolved within 1 minute, no TTP of jaw  Medical Decision Making  Medically screening exam initiated at 5:12 PM.  Appropriate orders placed.  Todd Waters was informed that the remainder of the evaluation will be completed by another provider, this initial triage assessment does not replace that evaluation, and the importance of remaining in the ED until their evaluation is complete.  Concern for jaw dislocation, ordered CT maxillofacial   Jeanella Flattery 06/04/21 1734    Arby Barrette, MD 06/04/21 2359

## 2021-06-04 NOTE — ED Triage Notes (Signed)
C/o jaw pain that started an hour ago. Stated is now resolving,

## 2021-06-05 ENCOUNTER — Other Ambulatory Visit: Payer: Self-pay

## 2021-06-24 ENCOUNTER — Other Ambulatory Visit: Payer: Self-pay

## 2022-03-16 IMAGING — CT CT MAXILLOFACIAL W/O CM
3 series · 16 of 47 positions shown, 19 images · non-contrast
Comparison: None.

CLINICAL DATA: Acute right-sided jaw pain.

EXAM:
CT MAXILLOFACIAL WITHOUT CONTRAST
TECHNIQUE: Multidetector CT imaging of the maxillofacial structures was
performed. Multiplanar CT image reconstructions were also generated.

[Series 4: facialbone 2.0 (person_name) (person_name) · axial · 0.40mm/px · z∈[+1008,+1142]mm · 10 of 79 slices shown, 13 images]
[im 6/79  brain]
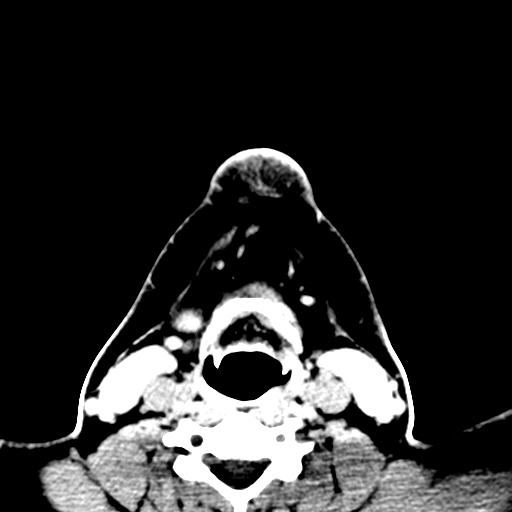
[im 6/79  bone]
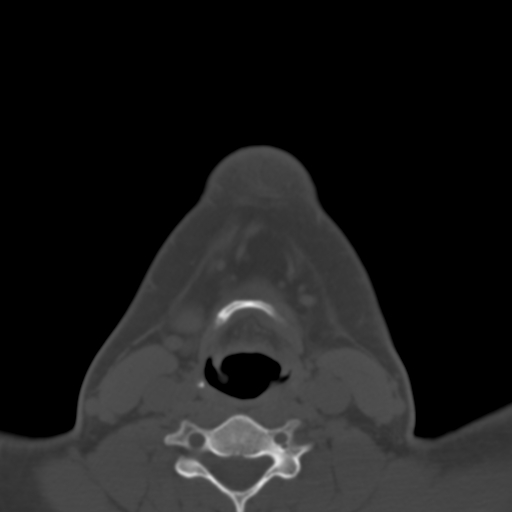
[im 14/79  bone]
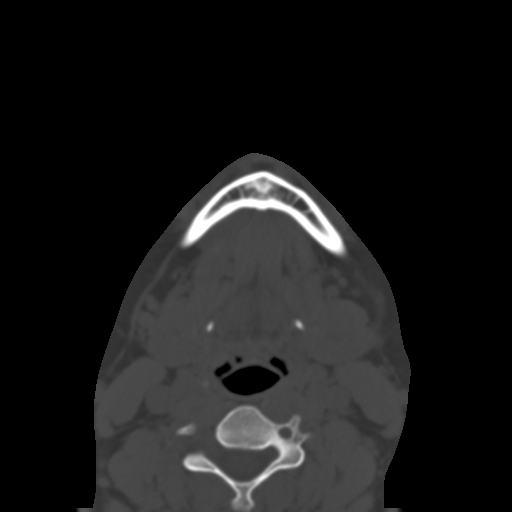
[im 22/79  bone]
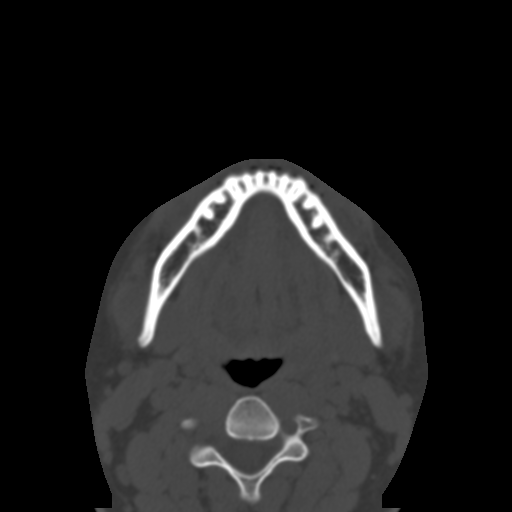
[im 27/79  bone]
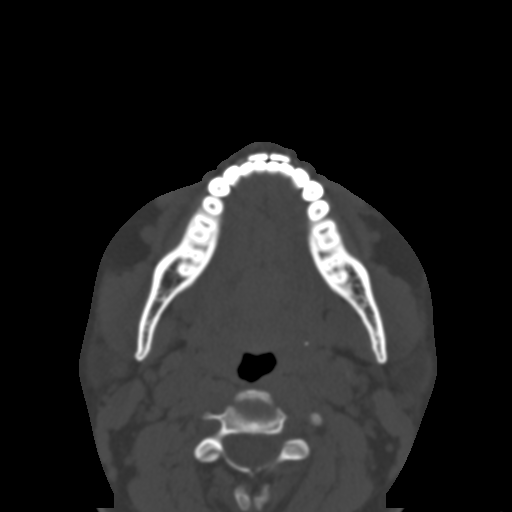
[im 35/79  brain]
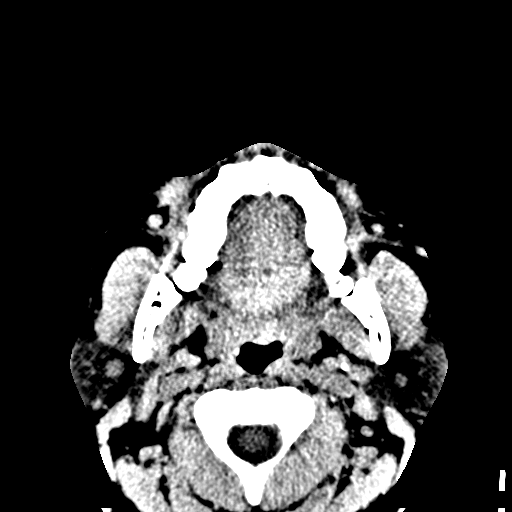
[im 35/79  bone]
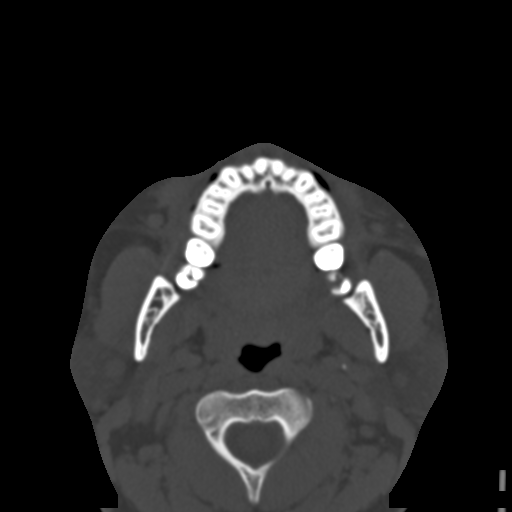
[im 44/79  bone]
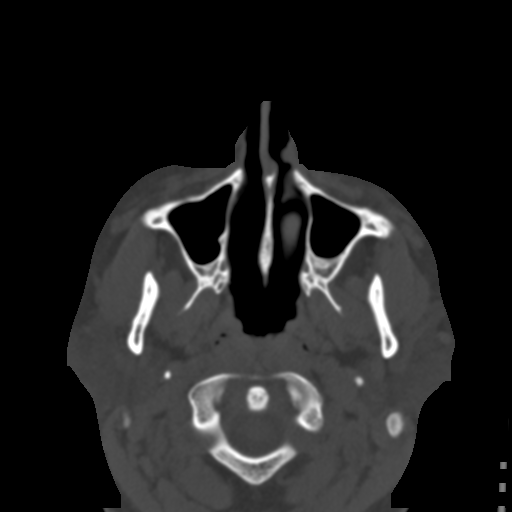
[im 52/79  bone]
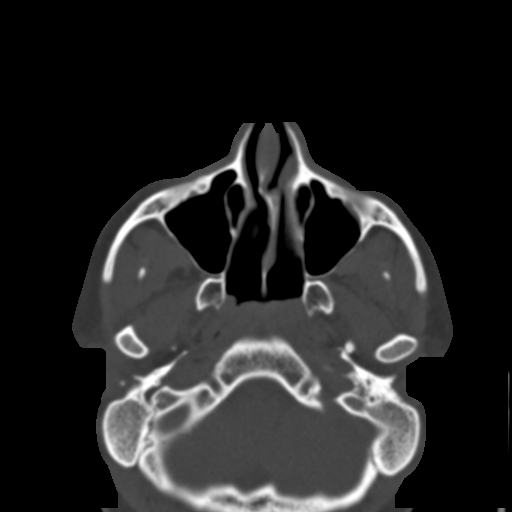
[im 60/79  bone]
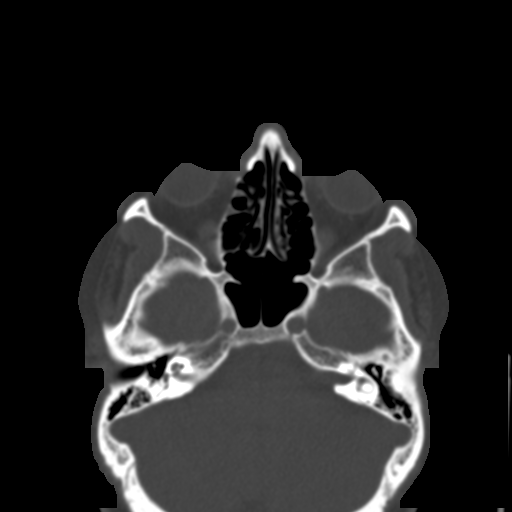
[im 65/79  brain]
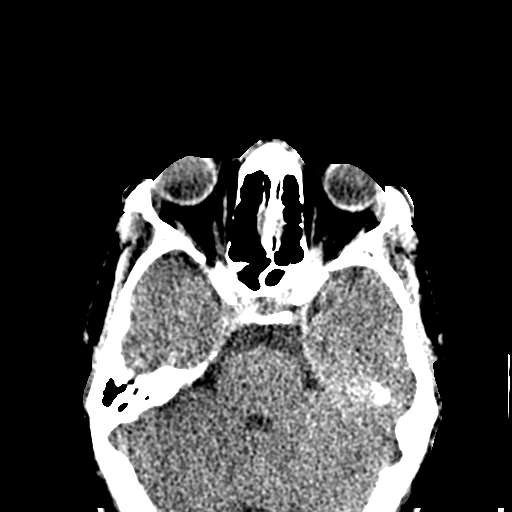
[im 65/79  bone]
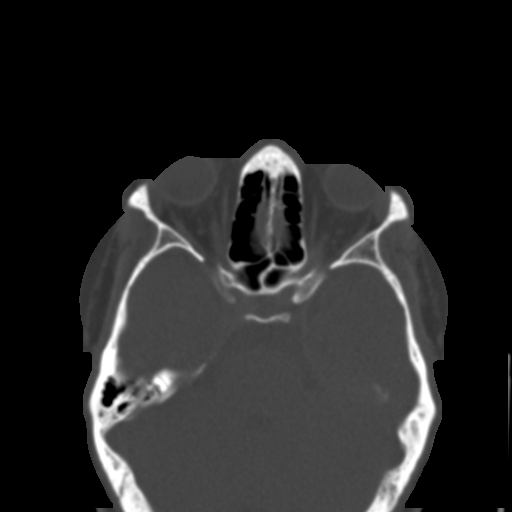
[im 73/79  bone]
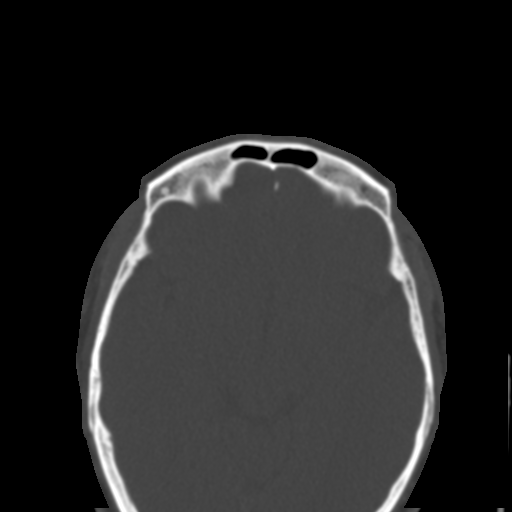

[Series 8: facialbone 2.0 cor st · coronal · 0.31mm/px · 3 of 83 slices shown]
[im 28/83  bone]
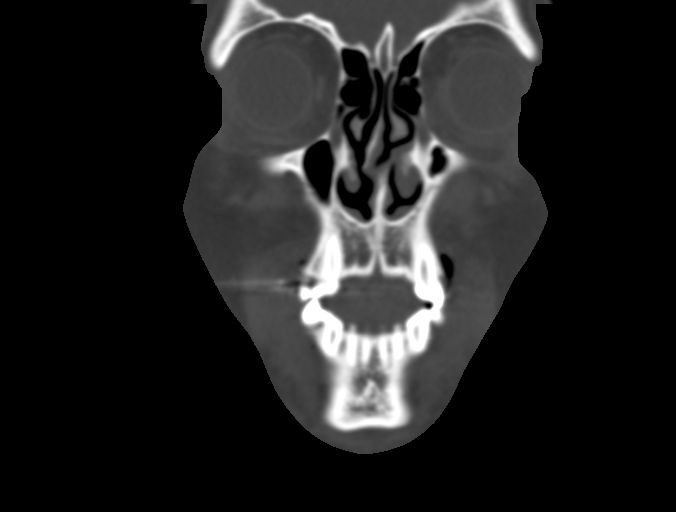
[im 37/83  bone]
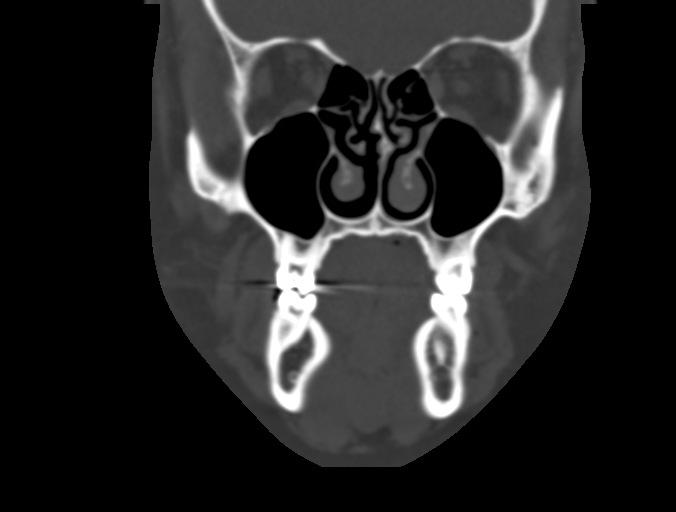
[im 46/83  bone]
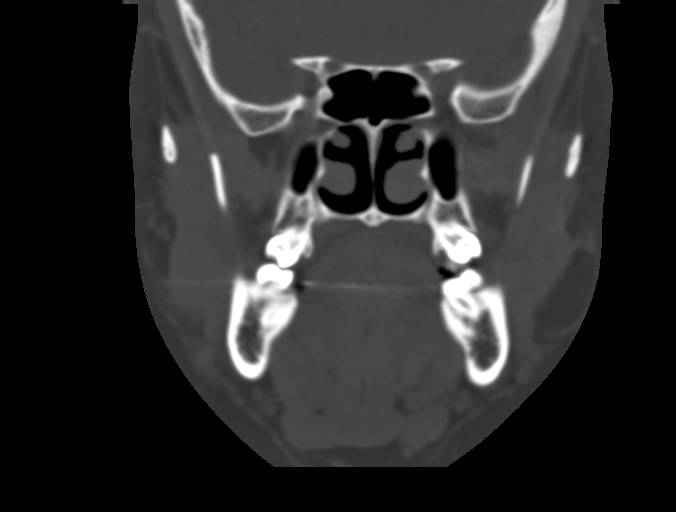

[Series 9: facialbone 2.0 sag st · sagittal · 0.34mm/px · 3 of 84 slices shown]
[im 28/84  bone]
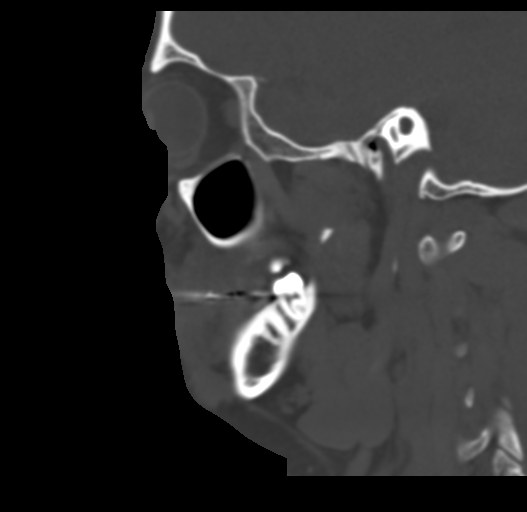
[im 42/84  bone]
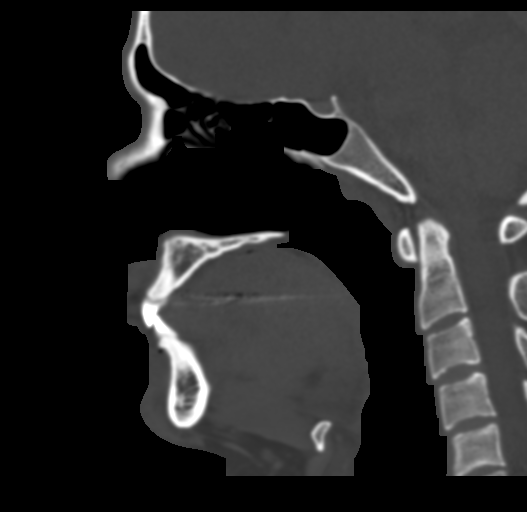
[im 56/84  bone]
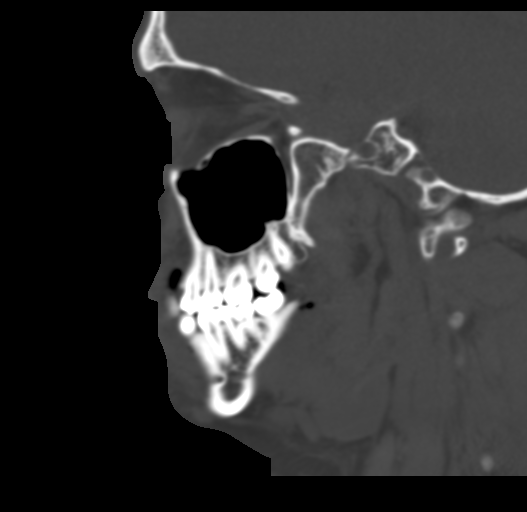

[16 of 47 positions shown; findings below may reference images not displayed]

FINDINGS: Osseous: No fracture or mandibular dislocation. No destructive
process.

Orbits: Negative. No traumatic or inflammatory finding.

Sinuses: Clear.

Soft tissues: Negative.

Limited intracranial: No significant or unexpected finding.
IMPRESSION: No abnormality seen in the maxillofacial region.
